# Patient Record
Sex: Male | Born: 2005 | Race: White | Hispanic: No | Marital: Single | Smoking: Never smoker
Health system: Southern US, Community
[De-identification: ages and names within clinical notes are randomized; demographics above are authoritative.]

## PROBLEM LIST (undated history)

## (undated) DIAGNOSIS — F840 Autistic disorder: Secondary | ICD-10-CM

## (undated) DIAGNOSIS — F809 Developmental disorder of speech and language, unspecified: Secondary | ICD-10-CM

## (undated) DIAGNOSIS — H905 Unspecified sensorineural hearing loss: Secondary | ICD-10-CM

## (undated) HISTORY — PX: TYMPANOSTOMY TUBE PLACEMENT: SHX32

## (undated) HISTORY — DX: Developmental disorder of speech and language, unspecified: F80.9

## (undated) HISTORY — DX: Unspecified sensorineural hearing loss: H90.5

## (undated) HISTORY — DX: Autistic disorder: F84.0

---
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
## 2024-01-23 ENCOUNTER — Encounter: Admitting: Family Medicine

## 2024-04-10 ENCOUNTER — Encounter: Payer: Self-pay | Admitting: Family Medicine

## 2024-04-10 ENCOUNTER — Ambulatory Visit: Admitting: Family Medicine

## 2024-04-10 VITALS — BP 128/82 | HR 89 | Temp 98.1°F | Ht 64.67 in | Wt 143.0 lb

## 2024-04-10 DIAGNOSIS — Z0001 Encounter for general adult medical examination with abnormal findings: Secondary | ICD-10-CM

## 2024-04-10 DIAGNOSIS — Z23 Encounter for immunization: Secondary | ICD-10-CM | POA: Diagnosis not present

## 2024-04-10 DIAGNOSIS — Z0000 Encounter for general adult medical examination without abnormal findings: Secondary | ICD-10-CM

## 2024-04-10 DIAGNOSIS — R6252 Short stature (child): Secondary | ICD-10-CM

## 2024-04-10 NOTE — Addendum Note (Signed)
 Addended by: ANGELENA RONAL BRADLEY K on: 04/10/2024 12:23 PM   Modules accepted: Orders

## 2024-04-10 NOTE — Progress Notes (Addendum)
 "  Subjective:    Patient ID: Patrick White, male    DOB: 01-05-2006, 19 y.o.   MRN: [id number]  HPI   Patient is a 19 year old developmentally delayed young man here today for complete physical exam.  Had a previous surgery to remove a right hydrocele.  This was performed in 2023.  He denies any pain in his testicles.  He denies any pain with activity.  Physical exam today shows 2 descended testicles with no evidence of a secondary hernia.  Patient has a history of hearing loss.  He has a slight speech abnormality related to his hearing loss.  Patient is currently 32nd percentile for weight and 143 pounds in 4th percentile for height and 5 foot 5 inches.  However constitutionally, the patient has similar build to his father. Past Medical History:  Diagnosis Date   Autism    Premature baby    Sensorineural hearing loss    Speech delay    Past Surgical History:  Procedure Laterality Date   HYDROCELE EXCISION Right 06/09/2021   Procedure: RIGHT HYDROCELECTOMY ADULT;  Surgeon: Matilda Senior, MD;  Location: [facility];  Service: Urology;  Laterality: Right;   TYMPANOSTOMY TUBE PLACEMENT     Current Outpatient Medications on File Prior to Visit  Medication Sig Dispense Refill   brompheniramine-pseudoephedrine-DM 30-2-10 MG/5ML syrup Take 5 mLs by mouth 4 (four) times daily as needed. (Patient not taking: Reported on 04/10/2024) 140 mL 0   clobetasol ointment (TEMOVATE) 0.05 %  (Patient not taking: Reported on 04/10/2024)     fluticasone  (FLONASE ) 50 MCG/ACT nasal spray Place 2 sprays into both nostrils daily. (Patient not taking: Reported on 04/10/2024) 16 g 0   loratadine (CLARITIN) 10 MG tablet Take 10 mg by mouth daily. (Patient not taking: Reported on 04/10/2024)     No current facility-administered medications on file prior to visit.   Allergies  Allergen Reactions   Egg Solids, Whole     Nausea and vomiting   Social History   Socioeconomic History   Marital status:  Single    Spouse name: Not on file   Number of children: Not on file   Years of education: Not on file   Highest education level: Not on file  Occupational History   Not on file  Tobacco Use   Smoking status: Never   Smokeless tobacco: Never  Substance and Sexual Activity   Alcohol use: No    Alcohol/week: 0.0 standard drinks of alcohol   Drug use: No   Sexual activity: Never  Other Topics Concern   Not on file  Social History Narrative   Not on file   Social Drivers of Health   Tobacco Use: Low Risk (04/10/2024)   Patient History    Smoking Tobacco Use: Never    Smokeless Tobacco Use: Never    Passive Exposure: Not on file  Financial Resource Strain: Not on file  Food Insecurity: Not on file  Transportation Needs: Not on file  Physical Activity: Not on file  Stress: Not on file  Social Connections: Not on file  Intimate Partner Violence: Not on file  Depression (PHQ2-9): Low Risk (01/21/2023)   Depression (PHQ2-9)    PHQ-2 Score: 0  Alcohol Screen: Not on file  Housing: Not on file  Utilities: Not on file  Health Literacy: Not on file   Family History  Problem Relation Age of Onset   Alcohol abuse Father       Review of Systems  All other systems reviewed and are negative.      Objective:        Physical Exam Vitals reviewed.  Constitutional:      General: He is not in acute distress.    Appearance: He is well-developed. He is not diaphoretic.  HENT:     Head: Atraumatic.     Right Ear: Tympanic membrane normal.     Left Ear: Tympanic membrane normal.     Nose: Nose normal.     Mouth/Throat:     Mouth: Mucous membranes are moist.    Dentition: No dental caries.  Eyes:     General:        Right eye: No discharge.        Left eye: No discharge.     Conjunctiva/sclera: Conjunctivae normal.     Pupils: Pupils are equal, round, and reactive to light.  Cardiovascular:     Rate and Rhythm: Normal rate and regular rhythm.     Heart sounds: S1  normal and S2 normal. No murmur heard.   Pulmonary:     Effort: Pulmonary effort is normal. No respiratory distress or retractions.     Breath sounds: Normal breath sounds. No stridor. No wheezing, rhonchi or rales.  Abdominal:     General: Bowel sounds are normal. There is no distension.     Palpations: Abdomen is soft. There is no mass.     Tenderness: There is no abdominal tenderness. There is no guarding or rebound.     Hernia: No hernia is present.  Musculoskeletal:        General: No tenderness or deformity. Normal range of motion.     Cervical back: Normal range of motion and neck supple. No rigidity.  Skin:    General: Skin is warm.     Coloration: Skin is not pale.     Findings: No petechiae or rash. Rash is not purpuric.  Neurological:     Mental Status: He is alert.     Cranial Nerves: No cranial nerve deficit.     Motor: No abnormal muscle tone.     Coordination: Coordination normal.     Deep Tendon Reflexes: Reflexes are normal and symmetric.  Both testicles are descended and normal.  Patient does have some mild tenderness to palpation around the right testicle Assessment & Plan:  General medical exam - Plan: CBC with Differential/Platelet, Comprehensive metabolic panel with GFR  Short stature - Plan: CBC with Differential/Platelet, Comprehensive metabolic panel with GFR, TSH Patient does have short stature.  I would like to obtain baseline lab work today including a CBC a CMP and a TSH.  He received his flu shot today.  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  However he has perfect hearing at 1000 Hz, 2000 Hz, and 4000 Hz.  However he has good hearing at the higher frequencies and does not have any problems that would require hearing aid at the present time.   "

## 2024-04-11 LAB — COMPREHENSIVE METABOLIC PANEL WITH GFR
AG Ratio: 1.9 (calc) (ref 1.0–2.5)
ALT: 49 U/L — ABNORMAL HIGH (ref 8–46)
AST: 26 U/L (ref 12–32)
Albumin: 4.8 g/dL (ref 3.6–5.1)
Alkaline phosphatase (APISO): 75 U/L (ref 46–169)
BUN: 19 mg/dL (ref 7–20)
CO2: 30 mmol/L (ref 20–32)
Calcium: 9.7 mg/dL (ref 8.9–10.4)
Chloride: 105 mmol/L (ref 98–110)
Creat: 1.03 mg/dL (ref 0.60–1.24)
Globulin: 2.5 g/dL (ref 2.1–3.5)
Glucose, Bld: 95 mg/dL (ref 65–99)
Potassium: 4.4 mmol/L (ref 3.8–5.1)
Sodium: 141 mmol/L (ref 135–146)
Total Bilirubin: 0.5 mg/dL (ref 0.2–1.1)
Total Protein: 7.3 g/dL (ref 6.3–8.2)
eGFR: 107 mL/min/1.73m2

## 2024-04-11 LAB — CBC WITH DIFFERENTIAL/PLATELET
Absolute Lymphocytes: 1892 {cells}/uL (ref 850–3900)
Absolute Monocytes: 1081 {cells}/uL — ABNORMAL HIGH (ref 200–950)
Basophils Absolute: 32 {cells}/uL (ref 0–200)
Basophils Relative: 0.6 %
Eosinophils Absolute: 239 {cells}/uL (ref 15–500)
Eosinophils Relative: 4.5 %
HCT: 45.5 % (ref 39.4–51.1)
Hemoglobin: 15.4 g/dL (ref 13.2–17.1)
MCH: 31.2 pg (ref 27.0–33.0)
MCHC: 33.8 g/dL (ref 31.6–35.4)
MCV: 92.3 fL (ref 81.4–101.7)
MPV: 10.3 fL (ref 7.5–12.5)
Monocytes Relative: 20.4 %
Neutro Abs: 2056 {cells}/uL (ref 1500–7800)
Neutrophils Relative %: 38.8 %
Platelets: 224 Thousand/uL (ref 140–400)
RBC: 4.93 Million/uL (ref 4.20–5.80)
RDW: 11.8 % (ref 11.0–15.0)
Total Lymphocyte: 35.7 %
WBC: 5.3 Thousand/uL (ref 3.8–10.8)

## 2024-04-11 LAB — TSH: TSH: 2.41 m[IU]/L (ref 0.50–4.30)

## 2024-04-13 ENCOUNTER — Ambulatory Visit: Payer: Self-pay | Admitting: Family Medicine

## 2025-04-12 ENCOUNTER — Encounter: Admitting: Family Medicine
